# Patient Record
Sex: Male | Born: 2000
Health system: Southern US, Community
[De-identification: ages and names within clinical notes are randomized; demographics above are authoritative.]

## PROBLEM LIST (undated history)

## (undated) DIAGNOSIS — D820 Wiskott-Aldrich syndrome: Secondary | ICD-10-CM

## (undated) DIAGNOSIS — A403 Sepsis due to Streptococcus pneumoniae: Secondary | ICD-10-CM

## (undated) DIAGNOSIS — T7840XA Allergy, unspecified, initial encounter: Secondary | ICD-10-CM

## (undated) DIAGNOSIS — Z933 Colostomy status: Secondary | ICD-10-CM

## (undated) HISTORY — DX: Sepsis due to Streptococcus pneumoniae: A40.3

## (undated) HISTORY — PX: PORTACATH PLACEMENT: SHX2246

## (undated) HISTORY — PX: APPENDECTOMY: SHX54

## (undated) HISTORY — DX: Colostomy status: Z93.3

## (undated) HISTORY — DX: Allergy, unspecified, initial encounter: T78.40XA

## (undated) HISTORY — DX: Wiskott-Aldrich syndrome: D82.0

---
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
## 2021-01-21 ENCOUNTER — Encounter: Payer: Self-pay | Admitting: Pediatrics

## 2021-01-21 ENCOUNTER — Other Ambulatory Visit: Payer: Self-pay

## 2021-01-21 ENCOUNTER — Ambulatory Visit (INDEPENDENT_AMBULATORY_CARE_PROVIDER_SITE_OTHER): Payer: BC Managed Care – PPO | Admitting: Pediatrics

## 2021-01-21 VITALS — BP 118/74 | Ht 68.5 in | Wt 224.5 lb

## 2021-01-21 DIAGNOSIS — Z68.41 Body mass index (BMI) pediatric, greater than or equal to 95th percentile for age: Secondary | ICD-10-CM | POA: Diagnosis not present

## 2021-01-21 DIAGNOSIS — Z0001 Encounter for general adult medical examination with abnormal findings: Secondary | ICD-10-CM | POA: Diagnosis not present

## 2021-01-21 DIAGNOSIS — Z23 Encounter for immunization: Secondary | ICD-10-CM

## 2021-01-21 DIAGNOSIS — N028 Recurrent and persistent hematuria with other morphologic changes: Secondary | ICD-10-CM

## 2021-01-21 DIAGNOSIS — Z00129 Encounter for routine child health examination without abnormal findings: Secondary | ICD-10-CM

## 2021-01-21 DIAGNOSIS — D820 Wiskott-Aldrich syndrome: Secondary | ICD-10-CM | POA: Diagnosis not present

## 2021-01-21 NOTE — Patient Instructions (Addendum)
Well Child Nutrition, Young Adult This sheet provides general nutrition recommendations. Talk with a health care provider or a diet and nutrition specialist (dietitian) if you have anyquestions. Nutrition The amount of food you need to eat every day depends on your age, sex, size, and activity level. To figure out your daily calorie needs, look for a caloriecalculator online or talk with your health care provider. Balanced diet Eat a balanced diet. Try to include: Fruits. Aim for 2 cups a day. Examples of 1 cup of fruit include 1 large banana, 1 small apple, 8 large strawberries, or 1 large orange. Eat a variety of whole fruits and 100% fruit juice. Choose fresh, canned, frozen, or dried forms. Choose canned fruit that has the lowest added sugar or no added sugar. Vegetables. Aim for 2-3 cups a day. Examples of 1 cup of vegetables include 2 medium carrots, 1 large tomato, or 2 stalks of celery. Choose fresh, frozen, canned, and dried options. Eat vegetables of a variety of colors. Low-fat dairy. Aim for 3 cups a day. Examples of 1 cup of dairy include 8 oz (230 mL) of milk, 8 oz (230 g) of yogurt, or 1 oz (44 g) of natural cheese. Choose fat-free or low-fat dairy products, including milk, yogurt, and cheese. If you are unable to tolerate dairy (lactose intolerant) or you choose not to consume dairy, you may include fortified soy beverages (soy milk). Whole grains. Of the grain foods that you eat each day (such as pasta, rice, and tortillas), aim to include 6-8 "ounce-equivalents" of whole-grain options. Examples of 1 ounce-equivalent of whole grains include 1 cup of whole-wheat cereal,  cup of brown rice, or 1 slice of whole-wheat bread. Try to choose whole grains including brown rice, wild rice, quinoa, and oats. Lean proteins. Aim for 5-6 "ounce-equivalents" a day. Eat a variety of protein foods, including lean meats, seafood, poultry, eggs, legumes (beans and peas), nuts, seeds, and soy  products. A cut of meat or fish that is the size of a deck of cards is about 3-4 ounce-equivalents. Foods that provide 1 ounce-equivalent of protein include 1 egg,  cup of nuts or seeds, or 1 tablespoon (16 g) of peanut butter. For more information and options for foods in a balanced diet, visit www.DisposableNylon.be Tips for healthy snacking A snack should not be the size of a full meal. Eat snacks that have 200 calories or less. Examples include:  whole-wheat pita with  cup hummus. 2 or 3 slices of deli [place] wrapped around a cheese stick.  apple with 1 tablespoon of peanut butter. 10 baked chips with salsa. Keep cut-up fruits and vegetables available at home and at school so they are easy to eat. Pack healthy snacks the night before or when you pack your lunch. Avoid pre-packaged foods. These tend to be higher in fat, sugar, and salt (sodium). Get involved with shopping, or ask the primary food shopper in your household to get healthy snacks that you like. Avoid chips, candy, cake, and soft drinks. Foods to avoid Isaac Porter or heavily processed foods, such as toaster pastries and microwaveable dinners. Drinks that contain a lot of sugar, such as sports drinks, sodas, and juice. Foods that contain a lot of fat, sodium, or sugar. Food safety Prepare your food safely: Wash your hands after handling raw meats. Keep food preparation surfaces clean by washing them regularly with hot, soapy water. Keep raw meats separate from foods that are ready-to-eat, such as fruits and vegetables. Cook seafood, meat, poultry,  meat, poultry, and eggs to the recommended minimum safe internal temperature. Store foods at safe temperatures. In general: Keep cold foods at 40F (4C) or colder. Keep your freezer at 0F (-18C or 18 degrees below 0C) or colder. Keep hot foods at 140F (60C) or warmer. Foods are no longer safe to eat when they have been at a temperature of 40-140F (4-60C) for more than 2 hours. Physical  activity Try to get 150 minutes of moderate-intensity physical activity each week. Examples include walking briskly or bicycling slower than 10 miles an hour (16 km an hour). Do muscle-strengthening exercises on 2 or more days a week. If you find it difficult to fit regular physical activity into your schedule, try: Taking the stairs instead of the elevator. Parking your car farther from the entrance or at the back of the parking lot. Biking or walking to work or school. If you need to lose weight, you may need to reduce your daily calorie intake and increase your daily amount of physical activity. Check with your health care provider before you start a new diet and exercise plan. General instructions Do not skip meals, especially breakfast. Water is the ideal beverage. Aim to drink six 8-oz glasses of water each day. Avoid fad diets. These may affect your mood and growth. If you choose to consume alcohol: Drink in moderation. This means two drinks a day for men and one drink a day for nonpregnant women. One drink equals 12 oz of beer, 5 oz of wine, or 1 oz of hard liquor. You may drink coffee. It is recommended that you limit coffee intake to three to five 8-oz cups a day (up to 400 mg of caffeine). If you are worried about your body image, talk with your parents, your health care provider, or another trusted adult like a coach or counselor. You may be at risk for developing an eating disorder. Eating disorders can lead to serious medical problems. Food allergies may cause you to have a reaction (such as a rash, diarrhea, or vomiting) after eating or drinking. Talk with your health care provider if you have concerns about food allergies. Summary Eat a balanced diet. Include fruits, vegetables, low-fat dairy, whole grains, and lean proteins. Try to get 150 minutes of moderate-intensity physical activity each week, and do muscle-strengthening exercises on 2 or more days a week. Choose healthy  snacks that are 200 calories or less. Drink plenty of water. Try to drink six 8-oz glasses a day. This information is not intended to replace advice given to you by your health care provider. Make sure you discuss any questions you have with your health care provider. Document Revised: 07/02/2020 Document Reviewed: 07/02/2020 Elsevier Patient Education  2022 Elsevier Inc.  

## 2021-01-21 NOTE — Progress Notes (Signed)
Adolescent Well Care Visit Isaac Porter is a 20 y.o. male who is here for well care.    PCP:  Georgiann Hahn, MD   History was provided by the patient.  Confidentiality was discussed with the patient and, if applicable, with caregiver as well.  --h/o wiskott-aldrich syndrome IgA Nephropathy, h/o splenectomy.  Takes daily PenV and weekly IgG injections.   Followed by all immunology and nephrology but has not been back since prior to pandemic early 2020.  H/o asthma but last need in childhood and not needing albuterol.  Has plan if he gets fever to be seen at [facility].  Reports having asymptomatic Covid this year in march/April.    He hasnt gone for f/u of since before covid and mom wanting to know if he can get CBC and CMP.    Current Issues: Current concerns include: no concerns.   Nutrition: Nutrition/Eating Behaviors: good eater, 3 meals/day plus snacks, all food groups, mainly drinks water, milk Adequate calcium in diet?: adequate Supplements/ Vitamins: none  Exercise/ Media: Play any Sports?/ Exercise: occasional, marching band Screen Time:  > 2 hours-counseling provided Media Rules or Monitoring?: yes  Sleep:  Sleep: depends but works late.   Social Screening: Lives with:  mom, dad Parental relations:  good Activities, Work, and Regulatory affairs officer?: marching band, [employer] Concerns regarding behavior with peers?  no Stressors of note: no  Education: School Name: [employer], Therapist, occupational: doing well; no concerns School Behavior: doing well; no concerns  Menstruation:   No LMP for male patient. Menstrual History: male   Confidential Social History: Tobacco?  no Secondhand smoke exposure?  no Drugs/ETOH?  no  Sexually Active?  Yes, girlfriend, having sex, 1 partner, using condoms and BC.  Declined testing. Pregnancy Prevention: discussed.  Safe at home, in school & in relationships?  Yes Safe to self?  Yes   Screenings: Patient has a dental home: yes,  has dentist, brush bid  eating habits, exercise habits, reproductive health, and mental health.  Issues were addressed and counseling provided.  Additional topics were addressed as anticipatory guidance.  PHQ-9 completed and results indicated no concerns.  Physical Exam:  Vitals:   01/21/21 1445  BP: 118/74  Weight: 224 lb 8 oz (101.8 kg)  Height: 5' 8.5" (1.74 m)   BP 118/74   Ht 5' 8.5" (1.74 m)   Wt 224 lb 8 oz (101.8 kg)   BMI 33.64 kg/m  Body mass index: body mass index is 33.64 kg/m. Blood pressure percentiles are not available for patients who are 18 years or older.  Hearing Screening   1000Hz  2000Hz  3000Hz  4000Hz   Right ear 20 20 20 20   Left ear 20 20 20 20    Vision Screening   Right eye Left eye Both eyes  Without correction 10/10 10/10   With correction       General Appearance:   alert, oriented, no acute distress, well nourished, and overweight  HENT: Normocephalic, no obvious abnormality, conjunctiva clear  Mouth:   Normal appearing teeth, no obvious discoloration, dental caries, or dental caps  Neck:   Supple; thyroid: no enlargement, symmetric, no tenderness/mass/nodules  Chest Normal male  Lungs:   Clear to auscultation bilaterally, normal work of breathing  Heart:   Regular rate and rhythm, S1 and S2 normal, no murmurs;   Abdomen:   Soft, non-tender, no mass, or organomegaly  GU genitalia not examined  Musculoskeletal:   Tone and strength strong and symmetrical, all extremities  no scoliosis        Lymphatic:   No cervical adenopathy  Skin/Hair/Nails:   Skin warm, dry and intact, no rashes, no bruises or petechiae  Neurologic:   Strength, gait, and coordination normal and age-appropriate     Assessment and Plan:   1. Encounter for routine child health examination without abnormal findings   2. BMI (body mass index), pediatric, 95-99% for age   35. Wiskott-Aldrich syndrome (HCC)   4. IgA nephropathy     --school PE form filled for marching  band.   --discuss to follow up with nephrology and immunology as have not been back since pandemic started.  Discussed with mom on phone need to f/u with specialists and can get baseline CBC and CMP today.   --discussed plans for transition of care to adult provider when able to find PCP.   BMI is not appropriate for age:  Discussed lifestyle modifications with healthy eating with plenty of fruits and vegetables and exercise.  Limit junk foods, sweet drinks/snacks, refined foods and offer age appropriate portions and healthy choices with fruits and vegetables.     Hearing screening result:normal Vision screening result: normal  Counseling provided for all of the vaccine components  Orders Placed This Encounter  Procedures   Meningococcal B, OMV (Bexsero)   COMPLETE METABOLIC PANEL WITH GFR   CBC with Differential  --Indications, contraindications and side effects of vaccine/vaccines discussed with parent and parent verbally expressed understanding and also agreed with the administration of vaccine/vaccines as ordered above  today. --Can not receive HPV.    Return in about 1 year (around 01/21/2022).Marland Kitchen  Myles Gip, DO

## 2021-01-22 LAB — CBC WITH DIFFERENTIAL/PLATELET
Absolute Monocytes: 853 cells/uL (ref 200–950)
Basophils Absolute: 78 cells/uL (ref 0–200)
Basophils Relative: 0.9 %
Eosinophils Absolute: 183 cells/uL (ref 15–500)
Eosinophils Relative: 2.1 %
HCT: 48.5 % (ref 38.5–50.0)
Hemoglobin: 16.4 g/dL (ref 13.2–17.1)
Lymphs Abs: 2854 cells/uL (ref 850–3900)
MCH: 28 pg (ref 27.0–33.0)
MCHC: 33.8 g/dL (ref 32.0–36.0)
MCV: 82.8 fL (ref 80.0–100.0)
MPV: 10.3 fL (ref 7.5–12.5)
Monocytes Relative: 9.8 %
Neutro Abs: 4733 cells/uL (ref 1500–7800)
Neutrophils Relative %: 54.4 %
Platelets: 130 10*3/uL — ABNORMAL LOW (ref 140–400)
RBC: 5.86 10*6/uL — ABNORMAL HIGH (ref 4.20–5.80)
RDW: 14.2 % (ref 11.0–15.0)
Total Lymphocyte: 32.8 %
WBC: 8.7 10*3/uL (ref 3.8–10.8)

## 2021-01-22 LAB — COMPLETE METABOLIC PANEL WITH GFR
AG Ratio: 1.8 (calc) (ref 1.0–2.5)
ALT: 22 U/L (ref 8–46)
AST: 16 U/L (ref 12–32)
Albumin: 4.8 g/dL (ref 3.6–5.1)
Alkaline phosphatase (APISO): 84 U/L (ref 46–169)
BUN: 9 mg/dL (ref 7–20)
CO2: 24 mmol/L (ref 20–32)
Calcium: 10.2 mg/dL (ref 8.9–10.4)
Chloride: 104 mmol/L (ref 98–110)
Creat: 0.8 mg/dL (ref 0.60–1.26)
GFR, Est African American: 150 mL/min/{1.73_m2} (ref >=60)
GFR, Est Non African American: 130 mL/min/{1.73_m2} (ref >=60)
Globulin: 2.6 g/dL (calc) (ref 2.1–3.5)
Glucose, Bld: 81 mg/dL (ref 65–99)
Potassium: 4 mmol/L (ref 3.8–5.1)
Sodium: 139 mmol/L (ref 135–146)
Total Bilirubin: 0.5 mg/dL (ref 0.2–1.1)
Total Protein: 7.4 g/dL (ref 6.3–8.2)

## 2021-05-28 DIAGNOSIS — N028 Recurrent and persistent hematuria with other morphologic changes: Secondary | ICD-10-CM | POA: Diagnosis not present

## 2021-05-28 DIAGNOSIS — D820 Wiskott-Aldrich syndrome: Secondary | ICD-10-CM | POA: Diagnosis not present

## 2021-05-28 DIAGNOSIS — Z23 Encounter for immunization: Secondary | ICD-10-CM | POA: Diagnosis not present

## 2021-06-03 DIAGNOSIS — J069 Acute upper respiratory infection, unspecified: Secondary | ICD-10-CM | POA: Diagnosis not present

## 2021-06-03 DIAGNOSIS — N028 Recurrent and persistent hematuria with other morphologic changes: Secondary | ICD-10-CM | POA: Diagnosis not present

## 2021-06-03 DIAGNOSIS — D820 Wiskott-Aldrich syndrome: Secondary | ICD-10-CM | POA: Diagnosis not present

## 2021-06-03 DIAGNOSIS — Z20828 Contact with and (suspected) exposure to other viral communicable diseases: Secondary | ICD-10-CM | POA: Diagnosis not present

## 2021-06-03 DIAGNOSIS — Z20818 Contact with and (suspected) exposure to other bacterial communicable diseases: Secondary | ICD-10-CM | POA: Diagnosis not present

## 2021-07-24 DIAGNOSIS — H6690 Otitis media, unspecified, unspecified ear: Secondary | ICD-10-CM | POA: Diagnosis not present

## 2021-07-24 DIAGNOSIS — G4700 Insomnia, unspecified: Secondary | ICD-10-CM | POA: Diagnosis not present

## 2021-08-18 DIAGNOSIS — M7631 Iliotibial band syndrome, right leg: Secondary | ICD-10-CM | POA: Diagnosis not present

## 2021-09-08 ENCOUNTER — Other Ambulatory Visit: Payer: Self-pay | Admitting: Internal Medicine

## 2021-09-08 ENCOUNTER — Ambulatory Visit
Admission: RE | Admit: 2021-09-08 | Discharge: 2021-09-08 | Disposition: A | Discharge status: Home / Self Care | Payer: BC Managed Care – PPO | Source: Ambulatory Visit | Attending: Internal Medicine | Admitting: Internal Medicine

## 2021-09-08 DIAGNOSIS — R0981 Nasal congestion: Secondary | ICD-10-CM | POA: Diagnosis not present

## 2021-09-08 DIAGNOSIS — J3489 Other specified disorders of nose and nasal sinuses: Secondary | ICD-10-CM | POA: Diagnosis not present

## 2021-09-08 DIAGNOSIS — R059 Cough, unspecified: Secondary | ICD-10-CM | POA: Diagnosis not present

## 2021-09-08 DIAGNOSIS — N028 Recurrent and persistent hematuria with other morphologic changes: Secondary | ICD-10-CM | POA: Diagnosis not present

## 2021-09-08 DIAGNOSIS — Z03818 Encounter for observation for suspected exposure to other biological agents ruled out: Secondary | ICD-10-CM | POA: Diagnosis not present

## 2021-09-08 DIAGNOSIS — D820 Wiskott-Aldrich syndrome: Secondary | ICD-10-CM | POA: Diagnosis not present

## 2021-09-08 DIAGNOSIS — G4700 Insomnia, unspecified: Secondary | ICD-10-CM | POA: Diagnosis not present

## 2021-09-11 ENCOUNTER — Other Ambulatory Visit: Payer: Self-pay | Admitting: Internal Medicine

## 2021-09-11 ENCOUNTER — Ambulatory Visit
Admission: RE | Admit: 2021-09-11 | Discharge: 2021-09-11 | Disposition: A | Discharge status: Home / Self Care | Payer: BC Managed Care – PPO | Source: Ambulatory Visit | Attending: Internal Medicine | Admitting: Internal Medicine

## 2021-09-11 DIAGNOSIS — J0191 Acute recurrent sinusitis, unspecified: Secondary | ICD-10-CM

## 2021-09-11 DIAGNOSIS — J329 Chronic sinusitis, unspecified: Secondary | ICD-10-CM | POA: Diagnosis not present

## 2021-09-11 DIAGNOSIS — J0140 Acute pansinusitis, unspecified: Secondary | ICD-10-CM | POA: Diagnosis not present

## 2021-09-22 DIAGNOSIS — J0140 Acute pansinusitis, unspecified: Secondary | ICD-10-CM | POA: Diagnosis not present

## 2021-11-20 ENCOUNTER — Encounter (HOSPITAL_COMMUNITY): Payer: Self-pay | Admitting: *Deleted

## 2021-11-20 ENCOUNTER — Other Ambulatory Visit: Payer: Self-pay

## 2021-11-20 ENCOUNTER — Emergency Department (HOSPITAL_COMMUNITY): Payer: BC Managed Care – PPO

## 2021-11-20 ENCOUNTER — Emergency Department (HOSPITAL_COMMUNITY)
Admission: EM | Admit: 2021-11-20 | Discharge: 2021-11-21 | Discharge status: Against Medical Advice | Payer: BC Managed Care – PPO | Attending: Emergency Medicine | Admitting: Emergency Medicine

## 2021-11-20 DIAGNOSIS — D72829 Elevated white blood cell count, unspecified: Secondary | ICD-10-CM | POA: Diagnosis not present

## 2021-11-20 DIAGNOSIS — J029 Acute pharyngitis, unspecified: Secondary | ICD-10-CM | POA: Insufficient documentation

## 2021-11-20 DIAGNOSIS — Z9081 Acquired absence of spleen: Secondary | ICD-10-CM | POA: Diagnosis not present

## 2021-11-20 DIAGNOSIS — N028 Recurrent and persistent hematuria with other morphologic changes: Secondary | ICD-10-CM | POA: Diagnosis not present

## 2021-11-20 DIAGNOSIS — R509 Fever, unspecified: Secondary | ICD-10-CM | POA: Diagnosis not present

## 2021-11-20 DIAGNOSIS — M7918 Myalgia, other site: Secondary | ICD-10-CM | POA: Diagnosis not present

## 2021-11-20 DIAGNOSIS — D820 Wiskott-Aldrich syndrome: Secondary | ICD-10-CM | POA: Diagnosis not present

## 2021-11-20 DIAGNOSIS — Z5321 Procedure and treatment not carried out due to patient leaving prior to being seen by health care provider: Secondary | ICD-10-CM | POA: Diagnosis not present

## 2021-11-20 DIAGNOSIS — H66005 Acute suppurative otitis media without spontaneous rupture of ear drum, recurrent, left ear: Secondary | ICD-10-CM | POA: Diagnosis not present

## 2021-11-20 DIAGNOSIS — R059 Cough, unspecified: Secondary | ICD-10-CM | POA: Diagnosis not present

## 2021-11-20 DIAGNOSIS — J069 Acute upper respiratory infection, unspecified: Secondary | ICD-10-CM | POA: Diagnosis not present

## 2021-11-20 LAB — CBC WITH DIFFERENTIAL/PLATELET
Abs Immature Granulocytes: 0.04 10*3/uL (ref 0.00–0.07)
Basophils Absolute: 0.1 10*3/uL (ref 0.0–0.1)
Basophils Relative: 0 %
Eosinophils Absolute: 0.1 10*3/uL (ref 0.0–0.5)
Eosinophils Relative: 1 %
HCT: 50.9 % (ref 39.0–52.0)
Hemoglobin: 17.2 g/dL — ABNORMAL HIGH (ref 13.0–17.0)
Immature Granulocytes: 0 %
Lymphocytes Relative: 12 %
Lymphs Abs: 1.5 10*3/uL (ref 0.7–4.0)
MCH: 28.2 pg (ref 26.0–34.0)
MCHC: 33.8 g/dL (ref 30.0–36.0)
MCV: 83.4 fL (ref 80.0–100.0)
Monocytes Absolute: 1.4 10*3/uL — ABNORMAL HIGH (ref 0.1–1.0)
Monocytes Relative: 11 %
Neutro Abs: 9.1 10*3/uL — ABNORMAL HIGH (ref 1.7–7.7)
Neutrophils Relative %: 76 %
Platelets: 106 10*3/uL — ABNORMAL LOW (ref 150–400)
RBC: 6.1 MIL/uL — ABNORMAL HIGH (ref 4.22–5.81)
RDW: 14.7 % (ref 11.5–15.5)
WBC: 12.1 10*3/uL — ABNORMAL HIGH (ref 4.0–10.5)
nRBC: 0 % (ref 0.0–0.2)

## 2021-11-20 LAB — BASIC METABOLIC PANEL
Anion gap: 10 (ref 5–15)
BUN: 11 mg/dL (ref 6–20)
CO2: 23 mmol/L (ref 22–32)
Calcium: 9.5 mg/dL (ref 8.9–10.3)
Chloride: 105 mmol/L (ref 98–111)
Creatinine, Ser: 0.94 mg/dL (ref 0.61–1.24)
GFR, Estimated: >60 mL/min (ref >60)
Glucose, Bld: 82 mg/dL (ref 70–99)
Potassium: 3.7 mmol/L (ref 3.5–5.1)
Sodium: 138 mmol/L (ref 135–145)

## 2021-11-20 LAB — LACTIC ACID, PLASMA: Lactic Acid, Venous: 0.8 mmol/L (ref 0.5–1.9)

## 2021-11-20 NOTE — ED Triage Notes (Signed)
The pt is c/o generalized body aches fever sorethroat productive cough  since this am  he has two  chronic problems  mother at bedside she can tell you ?

## 2021-11-20 NOTE — ED Provider Triage Note (Signed)
To be aEmergency Medicine Provider Triage Evaluation Note ? ?Isaac Porter , a 21 y.o. male  was evaluated in triage.  Pt complains of fever, elevated wbc count. He is immunocompromised.  As of sore throat, cough, and congestion.  No shortness of breath or chest pain.  He was seen by his primary care doctor and found to have an elevated white count.  He was sent here due to his immunocompromise status and possible admission.  He was tested negative for strep throat, COVID, and flu. ? ?Review of Systems  ?Positive:  ?Negative:  ? ?Physical Exam  ?BP 140/80 (BP Location: Right Arm)   Pulse 91   Temp 99.2 ?F (37.3 ?C) (Oral)   Resp 18   Ht 5\' 8"  (1.727 m)   Wt 101.8 kg   SpO2 99%   BMI 34.12 kg/m?  ?Gen:   Awake, no distress   ?Resp:  Normal effort  ?MSK:   Moves extremities without difficulty  ?Other:  No nuchal rigidity on exam ? ?Medical Decision Making  ?Medically screening exam initiated at 7:36 PM.  Appropriate orders placed.  HOLTEN SPANO was informed that the remainder of the evaluation will be completed by another provider, this initial triage assessment does not replace that evaluation, and the importance of remaining in the ED until their evaluation is complete. ? ? ?  ?Isaac Heir, PA-C ?11/20/21 1937 ? ?

## 2021-11-20 NOTE — ED Triage Notes (Signed)
His doctor saw him earlier  tested for covid strep the flu and rsi  all negative one of his conditions  he has no immune  system  his white count is elevated this is why he was sent here ?

## 2021-11-21 DIAGNOSIS — R509 Fever, unspecified: Secondary | ICD-10-CM | POA: Diagnosis not present

## 2021-11-21 DIAGNOSIS — J069 Acute upper respiratory infection, unspecified: Secondary | ICD-10-CM | POA: Diagnosis not present

## 2021-11-21 DIAGNOSIS — R059 Cough, unspecified: Secondary | ICD-10-CM | POA: Diagnosis not present

## 2021-11-21 NOTE — ED Notes (Signed)
Patient left on own accord °

## 2021-11-25 LAB — CULTURE, BLOOD (ROUTINE X 2)
Culture: NO GROWTH
Culture: NO GROWTH
Special Requests: ADEQUATE
Special Requests: ADEQUATE

## 2022-03-08 ENCOUNTER — Encounter: Payer: Self-pay | Admitting: Pediatrics

## 2022-04-13 DIAGNOSIS — D820 Wiskott-Aldrich syndrome: Secondary | ICD-10-CM | POA: Diagnosis not present

## 2022-05-27 DIAGNOSIS — D801 Nonfamilial hypogammaglobulinemia: Secondary | ICD-10-CM | POA: Diagnosis not present

## 2022-05-27 DIAGNOSIS — D820 Wiskott-Aldrich syndrome: Secondary | ICD-10-CM | POA: Diagnosis not present

## 2022-07-15 DIAGNOSIS — D820 Wiskott-Aldrich syndrome: Secondary | ICD-10-CM | POA: Diagnosis not present

## 2022-07-15 DIAGNOSIS — D801 Nonfamilial hypogammaglobulinemia: Secondary | ICD-10-CM | POA: Diagnosis not present

## 2022-07-26 DIAGNOSIS — D801 Nonfamilial hypogammaglobulinemia: Secondary | ICD-10-CM | POA: Diagnosis not present

## 2022-07-26 DIAGNOSIS — D820 Wiskott-Aldrich syndrome: Secondary | ICD-10-CM | POA: Diagnosis not present

## 2022-09-07 DIAGNOSIS — D820 Wiskott-Aldrich syndrome: Secondary | ICD-10-CM | POA: Diagnosis not present

## 2022-09-07 DIAGNOSIS — D801 Nonfamilial hypogammaglobulinemia: Secondary | ICD-10-CM | POA: Diagnosis not present

## 2022-10-06 DIAGNOSIS — D801 Nonfamilial hypogammaglobulinemia: Secondary | ICD-10-CM | POA: Diagnosis not present

## 2022-10-06 DIAGNOSIS — D820 Wiskott-Aldrich syndrome: Secondary | ICD-10-CM | POA: Diagnosis not present

## 2022-10-28 DIAGNOSIS — Z23 Encounter for immunization: Secondary | ICD-10-CM | POA: Diagnosis not present

## 2022-10-28 DIAGNOSIS — Z0000 Encounter for general adult medical examination without abnormal findings: Secondary | ICD-10-CM | POA: Diagnosis not present

## 2022-11-11 DIAGNOSIS — Z23 Encounter for immunization: Secondary | ICD-10-CM | POA: Diagnosis not present

## 2022-11-15 DIAGNOSIS — D801 Nonfamilial hypogammaglobulinemia: Secondary | ICD-10-CM | POA: Diagnosis not present

## 2022-11-15 DIAGNOSIS — D820 Wiskott-Aldrich syndrome: Secondary | ICD-10-CM | POA: Diagnosis not present

## 2022-12-13 DIAGNOSIS — D801 Nonfamilial hypogammaglobulinemia: Secondary | ICD-10-CM | POA: Diagnosis not present

## 2022-12-13 DIAGNOSIS — D820 Wiskott-Aldrich syndrome: Secondary | ICD-10-CM | POA: Diagnosis not present

## 2023-03-01 DIAGNOSIS — D801 Nonfamilial hypogammaglobulinemia: Secondary | ICD-10-CM | POA: Diagnosis not present

## 2023-03-01 DIAGNOSIS — D820 Wiskott-Aldrich syndrome: Secondary | ICD-10-CM | POA: Diagnosis not present

## 2023-04-05 ENCOUNTER — Encounter: Payer: Self-pay | Admitting: Pediatrics

## 2023-05-04 DIAGNOSIS — D820 Wiskott-Aldrich syndrome: Secondary | ICD-10-CM | POA: Diagnosis not present

## 2023-05-04 DIAGNOSIS — D801 Nonfamilial hypogammaglobulinemia: Secondary | ICD-10-CM | POA: Diagnosis not present

## 2023-05-06 DIAGNOSIS — I10 Essential (primary) hypertension: Secondary | ICD-10-CM | POA: Diagnosis not present

## 2023-05-06 DIAGNOSIS — D801 Nonfamilial hypogammaglobulinemia: Secondary | ICD-10-CM | POA: Diagnosis not present

## 2023-05-06 DIAGNOSIS — D820 Wiskott-Aldrich syndrome: Secondary | ICD-10-CM | POA: Diagnosis not present

## 2023-05-06 DIAGNOSIS — E559 Vitamin D deficiency, unspecified: Secondary | ICD-10-CM | POA: Diagnosis not present

## 2023-05-06 DIAGNOSIS — N02B1 Recurrent and persistent immunoglobulin A nephropathy with glomerular lesion: Secondary | ICD-10-CM | POA: Diagnosis not present

## 2023-05-06 DIAGNOSIS — Z79899 Other long term (current) drug therapy: Secondary | ICD-10-CM | POA: Diagnosis not present

## 2023-05-06 DIAGNOSIS — Z9081 Acquired absence of spleen: Secondary | ICD-10-CM | POA: Diagnosis not present

## 2023-06-24 DIAGNOSIS — J019 Acute sinusitis, unspecified: Secondary | ICD-10-CM | POA: Diagnosis not present

## 2023-06-24 DIAGNOSIS — R509 Fever, unspecified: Secondary | ICD-10-CM | POA: Diagnosis not present

## 2023-10-31 DIAGNOSIS — D820 Wiskott-Aldrich syndrome: Secondary | ICD-10-CM | POA: Diagnosis not present

## 2023-10-31 DIAGNOSIS — N028 Recurrent and persistent hematuria with other morphologic changes: Secondary | ICD-10-CM | POA: Diagnosis not present

## 2023-10-31 DIAGNOSIS — Z0000 Encounter for general adult medical examination without abnormal findings: Secondary | ICD-10-CM | POA: Diagnosis not present

## 2023-10-31 DIAGNOSIS — Z9081 Acquired absence of spleen: Secondary | ICD-10-CM | POA: Diagnosis not present

## 2023-10-31 DIAGNOSIS — G4700 Insomnia, unspecified: Secondary | ICD-10-CM | POA: Diagnosis not present

## 2023-10-31 DIAGNOSIS — Z79899 Other long term (current) drug therapy: Secondary | ICD-10-CM | POA: Diagnosis not present

## 2023-11-10 IMAGING — CR DG CHEST 2V
2 series · 2 of 2 positions shown · non-contrast
Comparison: 09/08/2021

CLINICAL DATA: Upper respiratory infection

EXAM:
CHEST - 2 VIEW

[chest pa]
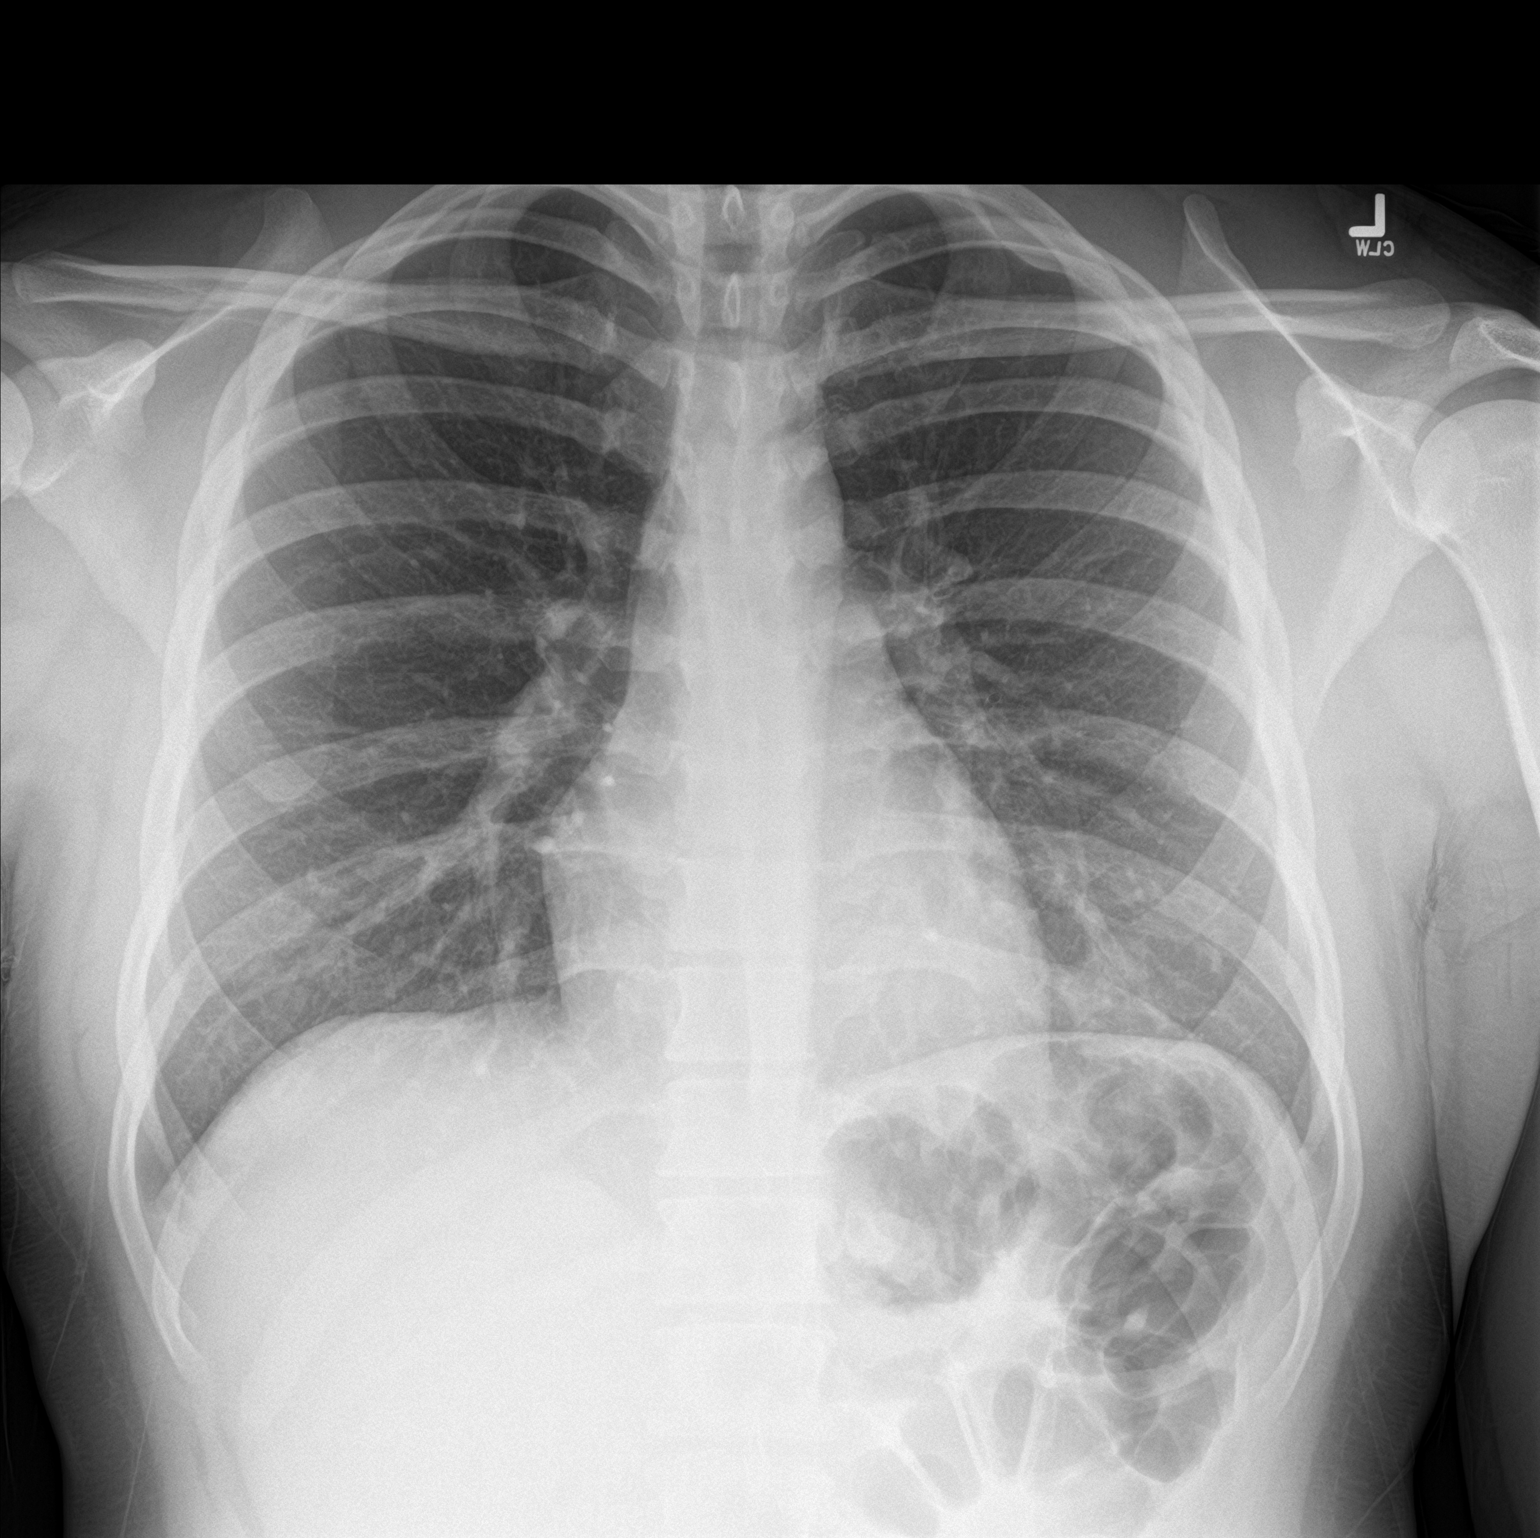

[chest lat]
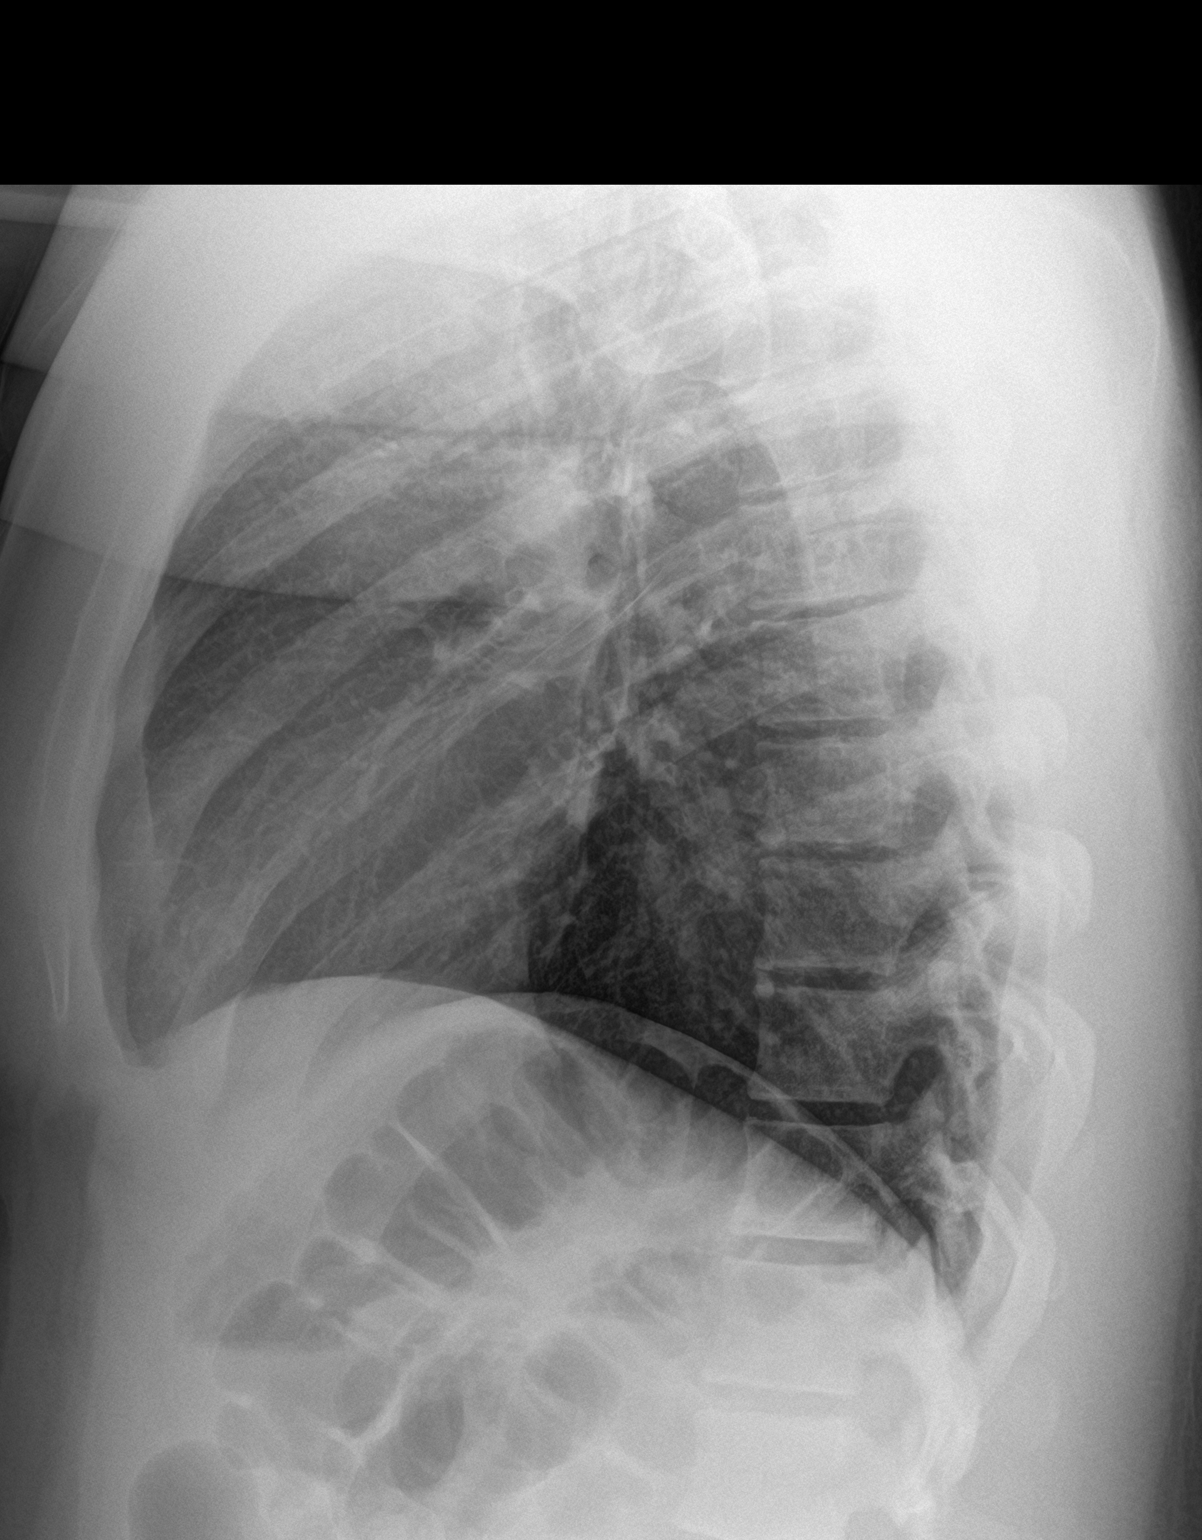

[2 of 2 positions shown; findings below may reference images not displayed]

FINDINGS: Small new patchy infiltrate is seen in the left lower lung fields.
Cardiac size is within normal limits. There are no signs of
pulmonary edema. There is no pleural effusion or pneumothorax.
IMPRESSION: New small patchy infiltrate in the left lower lung fields suggests
pneumonia. There is no pleural effusion.

## 2024-01-11 DIAGNOSIS — F411 Generalized anxiety disorder: Secondary | ICD-10-CM | POA: Diagnosis not present

## 2024-02-11 DIAGNOSIS — F411 Generalized anxiety disorder: Secondary | ICD-10-CM | POA: Diagnosis not present

## 2024-04-11 DIAGNOSIS — F411 Generalized anxiety disorder: Secondary | ICD-10-CM | POA: Diagnosis not present
